# Patient Record
Sex: Female | Born: 1985 | Race: Black or African American | Hispanic: No | Marital: Single | Smoking: Former smoker
Health system: Southern US, Community
[De-identification: ages and names within clinical notes are randomized; demographics above are authoritative.]

## PROBLEM LIST (undated history)

## (undated) HISTORY — PX: OTHER SURGICAL HISTORY: SHX169

---

## 2007-10-25 ENCOUNTER — Emergency Department: Payer: Self-pay | Admitting: Emergency Medicine

## 2010-06-26 ENCOUNTER — Emergency Department: Payer: Self-pay | Admitting: Internal Medicine

## 2010-08-19 ENCOUNTER — Emergency Department: Payer: Self-pay | Admitting: Emergency Medicine

## 2010-11-30 ENCOUNTER — Emergency Department: Payer: Self-pay | Admitting: Emergency Medicine

## 2011-09-14 ENCOUNTER — Emergency Department: Payer: Self-pay | Admitting: Emergency Medicine

## 2012-07-12 ENCOUNTER — Emergency Department: Payer: Self-pay | Admitting: Emergency Medicine

## 2012-07-12 LAB — URINALYSIS, COMPLETE
Blood: NEGATIVE
Glucose,UR: NEGATIVE mg/dL (ref 0–75)
Ph: 8 (ref 4.5–8.0)
Protein: 25
Squamous Epithelial: 27
WBC UR: 4 /HPF (ref 0–5)

## 2013-08-28 ENCOUNTER — Emergency Department: Payer: Self-pay | Admitting: Emergency Medicine

## 2013-10-11 ENCOUNTER — Emergency Department: Payer: Self-pay | Admitting: Emergency Medicine

## 2013-10-11 LAB — URINALYSIS, COMPLETE
Bilirubin,UR: NEGATIVE
Glucose,UR: NEGATIVE mg/dL (ref 0–75)
Nitrite: POSITIVE
PH: 5 (ref 4.5–8.0)
Protein: 30
Specific Gravity: 1.02 (ref 1.003–1.030)
Squamous Epithelial: 15

## 2013-10-11 LAB — COMPREHENSIVE METABOLIC PANEL
ALBUMIN: 3.7 g/dL (ref 3.4–5.0)
Alkaline Phosphatase: 58 U/L
Anion Gap: 7 (ref 7–16)
BUN: 6 mg/dL — ABNORMAL LOW (ref 7–18)
Bilirubin,Total: 1.3 mg/dL — ABNORMAL HIGH (ref 0.2–1.0)
CALCIUM: 9 mg/dL (ref 8.5–10.1)
CREATININE: 0.97 mg/dL (ref 0.60–1.30)
Chloride: 104 mmol/L (ref 98–107)
Co2: 23 mmol/L (ref 21–32)
EGFR (African American): >60
Glucose: 105 mg/dL — ABNORMAL HIGH (ref 65–99)
OSMOLALITY: 266 (ref 275–301)
POTASSIUM: 3.5 mmol/L (ref 3.5–5.1)
SGOT(AST): 17 U/L (ref 15–37)
SGPT (ALT): 13 U/L (ref 12–78)
Sodium: 134 mmol/L — ABNORMAL LOW (ref 136–145)
TOTAL PROTEIN: 8.2 g/dL (ref 6.4–8.2)

## 2013-10-11 LAB — CBC WITH DIFFERENTIAL/PLATELET
Basophil #: 0.1 10*3/uL (ref 0.0–0.1)
Basophil %: 0.8 %
Eosinophil #: 0 10*3/uL (ref 0.0–0.7)
Eosinophil %: 0.2 %
HCT: 41.9 % (ref 35.0–47.0)
HGB: 13.7 g/dL (ref 12.0–16.0)
LYMPHS PCT: 13.7 %
Lymphocyte #: 2.1 10*3/uL (ref 1.0–3.6)
MCH: 30.2 pg (ref 26.0–34.0)
MCHC: 32.7 g/dL (ref 32.0–36.0)
MCV: 92 fL (ref 80–100)
MONO ABS: 1.5 x10 3/mm — AB (ref 0.2–0.9)
Monocyte %: 9.6 %
Neutrophil #: 11.6 10*3/uL — ABNORMAL HIGH (ref 1.4–6.5)
Neutrophil %: 75.7 %
PLATELETS: 251 10*3/uL (ref 150–440)
RBC: 4.54 10*6/uL (ref 3.80–5.20)
RDW: 13.6 % (ref 11.5–14.5)
WBC: 15.4 10*3/uL — ABNORMAL HIGH (ref 3.6–11.0)

## 2013-10-11 LAB — LIPASE, BLOOD: LIPASE: 66 U/L — AB (ref 73–393)

## 2013-10-13 LAB — URINE CULTURE

## 2013-11-30 ENCOUNTER — Emergency Department: Payer: Self-pay | Admitting: Emergency Medicine

## 2013-11-30 LAB — CBC
HCT: 38.4 % (ref 35.0–47.0)
HGB: 12.7 g/dL (ref 12.0–16.0)
MCH: 31.1 pg (ref 26.0–34.0)
MCHC: 33.1 g/dL (ref 32.0–36.0)
MCV: 94 fL (ref 80–100)
PLATELETS: 234 10*3/uL (ref 150–440)
RBC: 4.08 10*6/uL (ref 3.80–5.20)
RDW: 13.7 % (ref 11.5–14.5)
WBC: 9.4 10*3/uL (ref 3.6–11.0)

## 2013-11-30 LAB — HCG, QUANTITATIVE, PREGNANCY: Beta Hcg, Quant.: 65 m[IU]/mL — ABNORMAL HIGH

## 2016-04-30 IMAGING — CT CT ABD-PELV W/ CM
2 of 4 series · 15 of 46 positions shown, 17 images · IV contrast (isovue)
Comparison: None.

CLINICAL DATA: Sharp pain in the right mid side of abdomen since
yesterday. Nausea and vomiting.

EXAM:
CT ABDOMEN AND PELVIS WITH CONTRAST
TECHNIQUE: Multidetector CT imaging of the abdomen and pelvis was performed
using the standard protocol following bolus administration of
intravenous contrast.
CONTRAST:  85 ml  Isovue 300

[Series 2: routine abd pel with · axial · 0.59mm/px · z∈[-995,-625]mm · 12 of 85 slices shown, 14 images]
[im 7/85  soft-tissue]
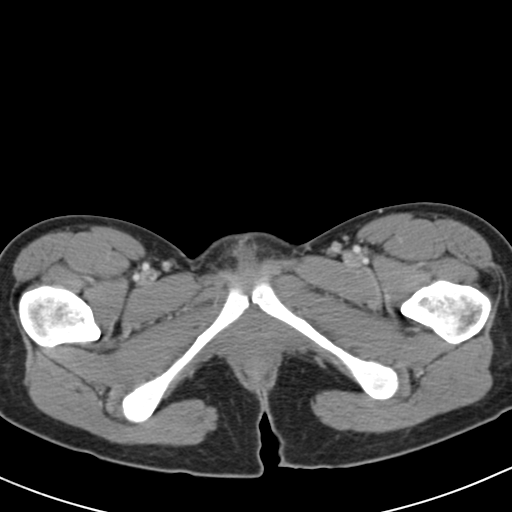
[im 7/85  bone]
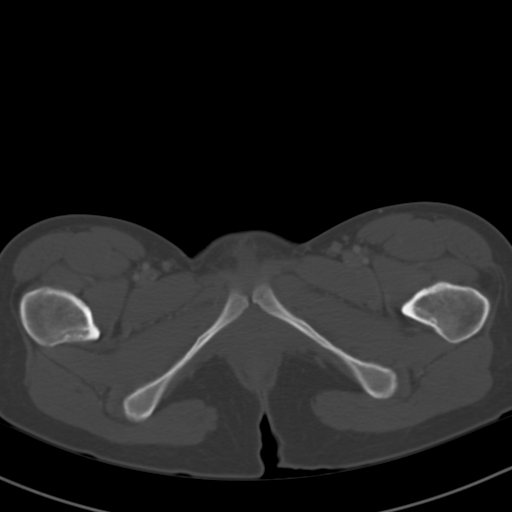
[im 14/85  soft-tissue]
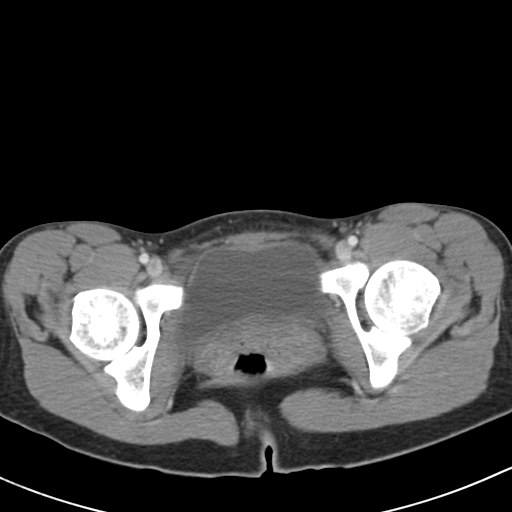
[im 21/85  soft-tissue]
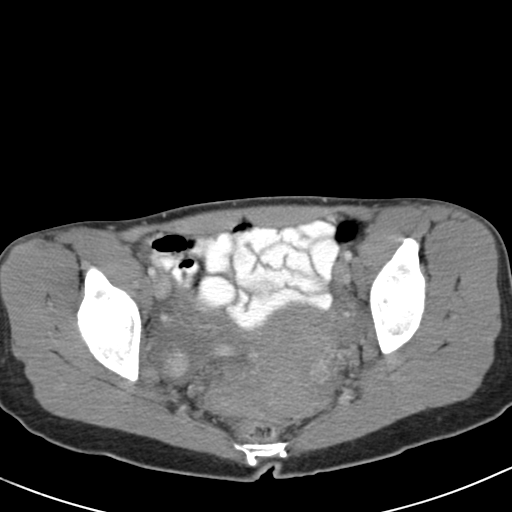
[im 27/85  soft-tissue]
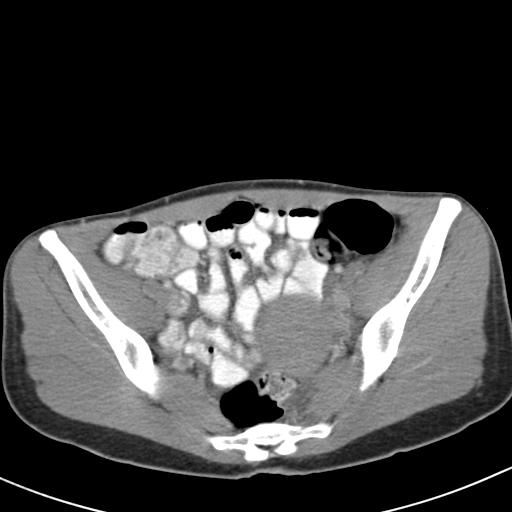
[im 34/85  soft-tissue]
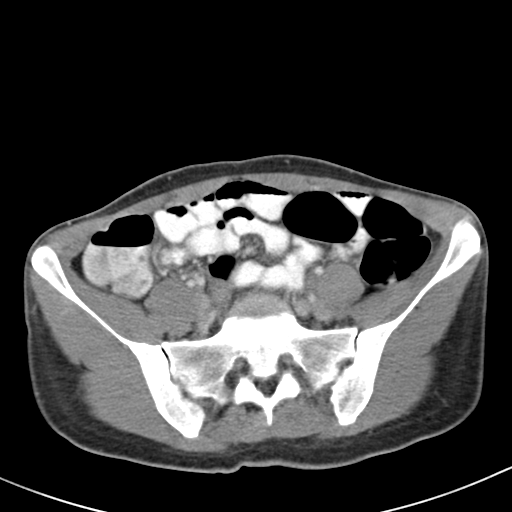
[im 41/85  soft-tissue]
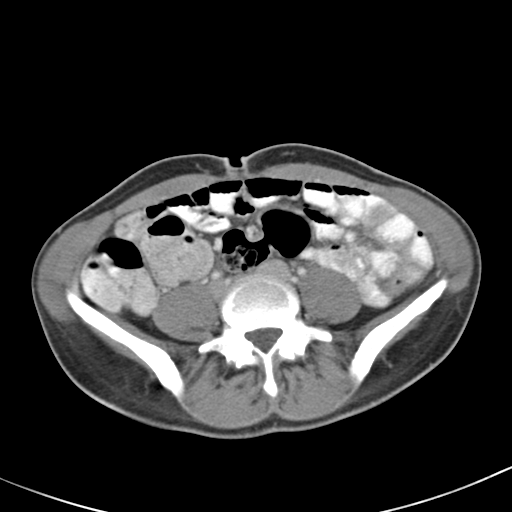
[im 48/85  soft-tissue]
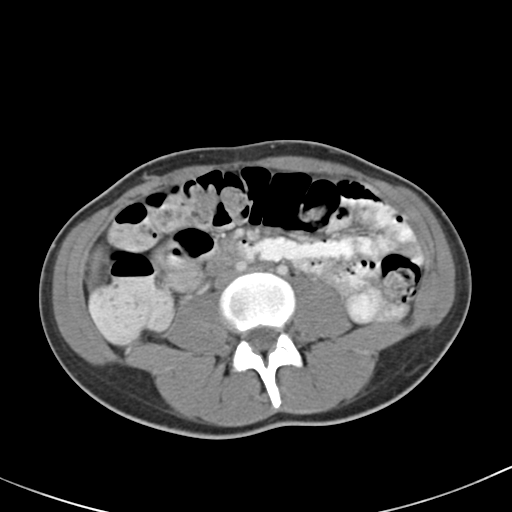
[im 54/85  soft-tissue]
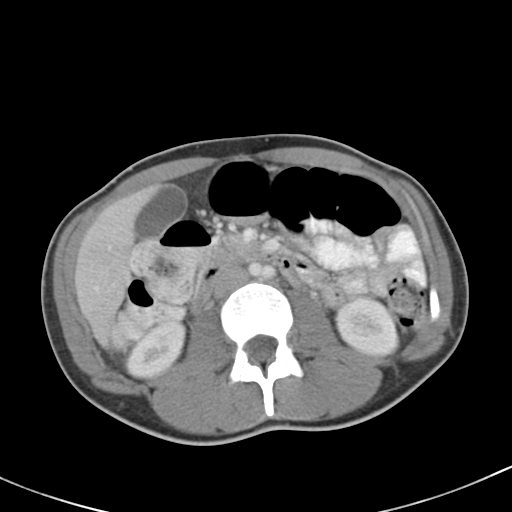
[im 61/85  soft-tissue]
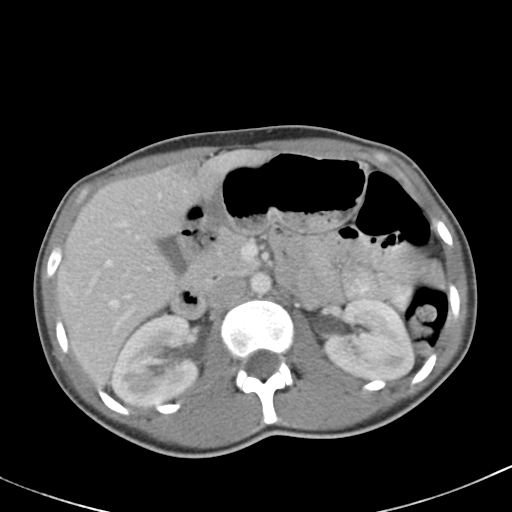
[im 61/85  bone]
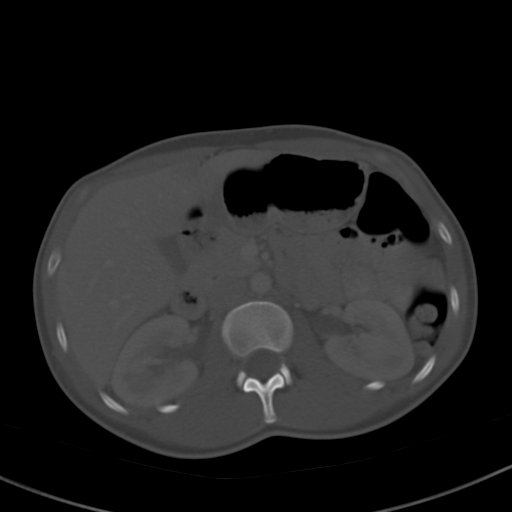
[im 68/85  soft-tissue]
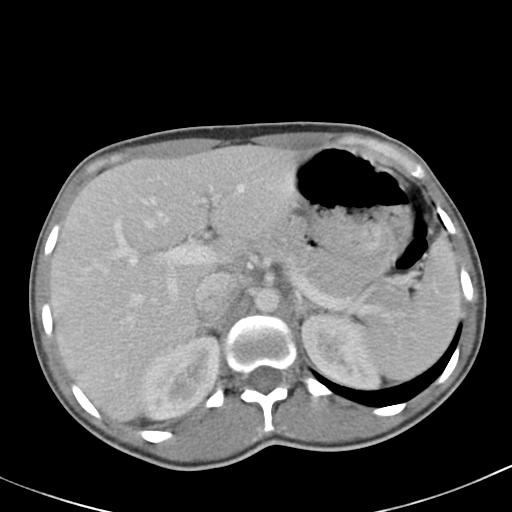
[im 74/85  soft-tissue]
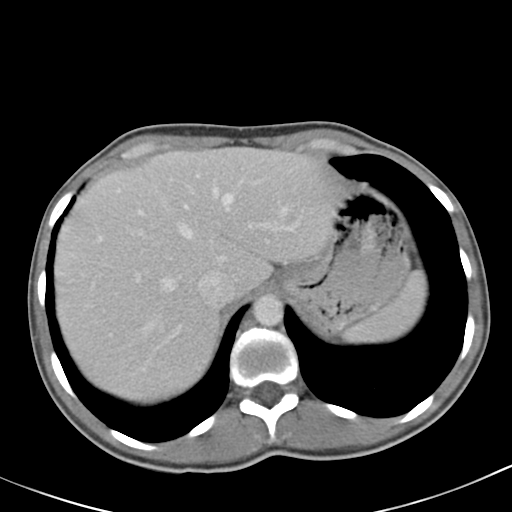
[im 81/85  soft-tissue]
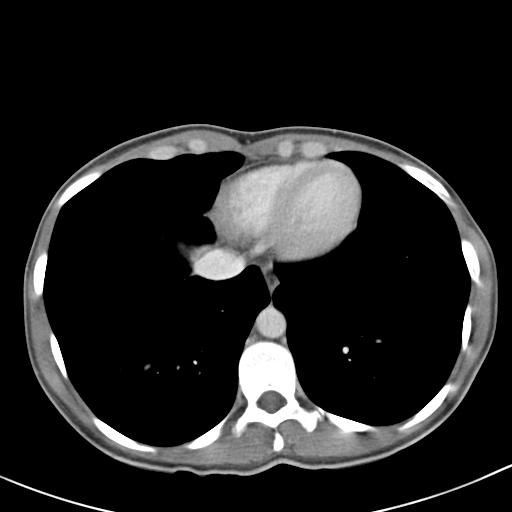

[Series 5: cor routine abd pel with · coronal · 0.57mm/px · 3 of 99 slices shown]
[im 33/99  soft-tissue]
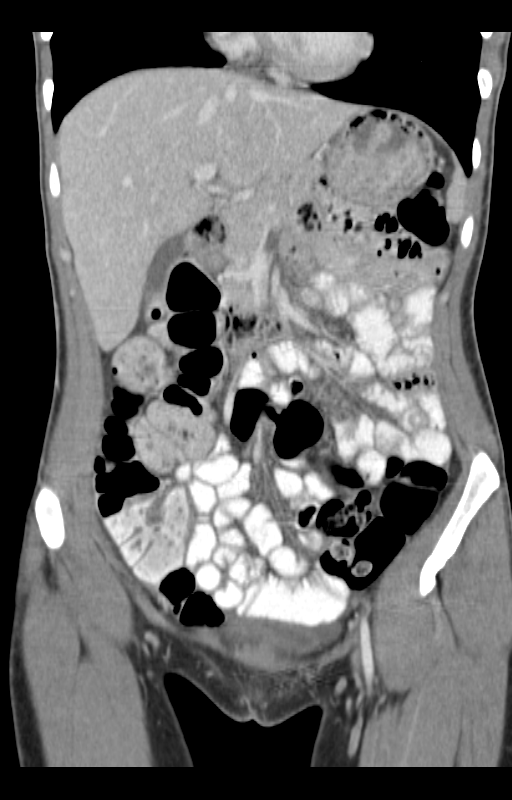
[im 44/99  soft-tissue]
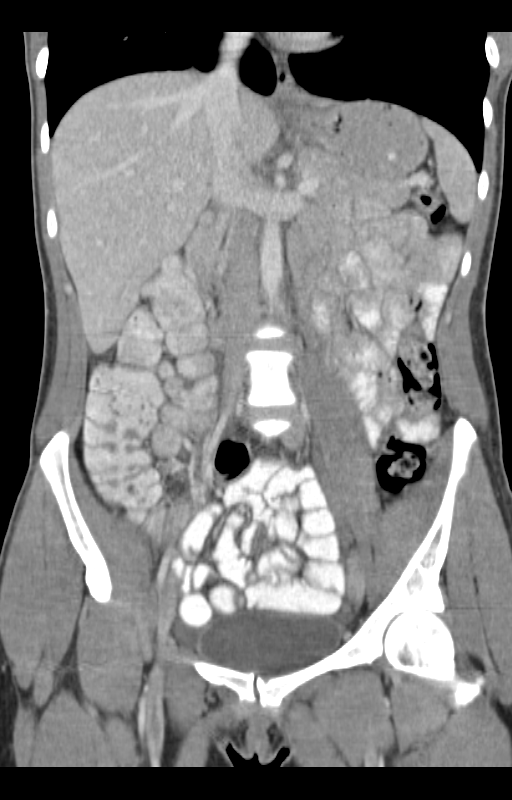
[im 55/99  soft-tissue]
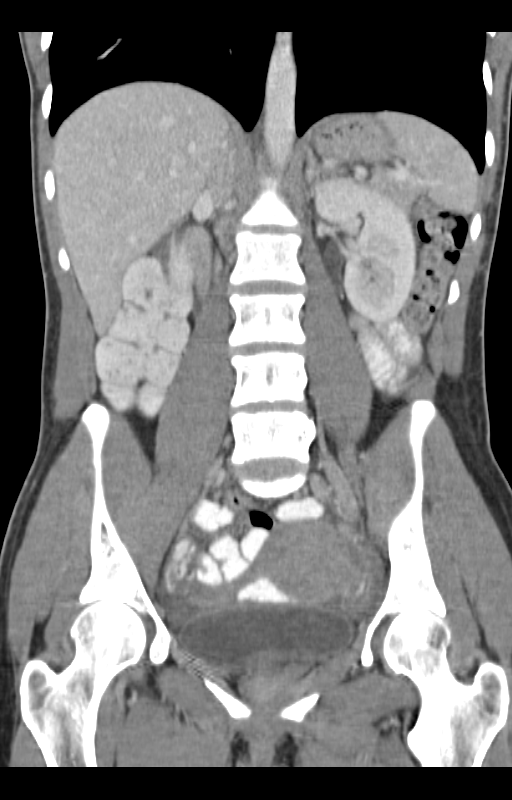

[15 of 46 positions shown; findings below may reference images not displayed]

FINDINGS: Lung bases are clear.

Focal low-attenuation in the liver adjacent to the falciform
ligament consistent with focal fatty infiltration. Tiny cyst in the
liver. The gallbladder, spleen, pancreas, adrenal glands, abdominal
aorta, inferior vena cava, and retroperitoneal lymph nodes are
unremarkable. There is focal heterogeneous appearance to the
nephrogram in the midportion of the right kidney posteriorly.
Visualization is limited as no delayed images were obtained but the
appearance is suspicious for focal process such as focal
pyelonephritis or possibly of focal mass lesion. No hydronephrosis
in either kidney. The stomach, small bowel, and colon are not
abnormally distended. No free air or free fluid in the abdomen.

Pelvis: The bladder wall is not thickened. Uterus and ovaries are
not enlarged. Small amount of free fluid in the pelvis is likely
physiologic. The probable involuting cyst in the right ovary. No
evidence of diverticulitis. Appendix is normal. No destructive bone
lesions.
IMPRESSION: Focal heterogeneous nephrogram in the right mid kidney probably
representing focal pyelonephritis although a mass lesion is not
entirely excluded. Correlation with clinical or laboratory evidence
of pyelonephritis is recommended. Consider followup after resolution
of acute process.

## 2021-11-28 ENCOUNTER — Ambulatory Visit: Payer: Medicaid Other | Admitting: Nurse Practitioner

## 2021-11-28 ENCOUNTER — Encounter: Payer: Self-pay | Admitting: Nurse Practitioner

## 2021-11-28 DIAGNOSIS — Z113 Encounter for screening for infections with a predominantly sexual mode of transmission: Secondary | ICD-10-CM | POA: Diagnosis not present

## 2021-11-28 LAB — HM HEPATITIS C SCREENING LAB: HM Hepatitis Screen: NEGATIVE

## 2021-11-28 LAB — HEPATITIS B SURFACE ANTIGEN

## 2021-11-28 LAB — WET PREP FOR TRICH, YEAST, CLUE
Trichomonas Exam: NEGATIVE
Yeast Exam: NEGATIVE

## 2021-11-28 LAB — HM HIV SCREENING LAB: HM HIV Screening: NEGATIVE

## 2021-11-28 NOTE — Progress Notes (Signed)
[facility] Department  STI clinic/screening visit [address] [place] [address] [phone]  Subjective:  Tykiera Commander is a 36 y.o. female being seen today for an STI screening visit. The patient reports they do not have symptoms.  Patient reports that they do not desire a pregnancy in the next year.   They reported they are not interested in discussing contraception today.    Patient's last menstrual period was 11/16/2021.   Patient has the following medical conditions:  There are no problems to display for this patient.   Chief Complaint  Patient presents with   SEXUALLY TRANSMITTED DISEASE    Screening    HPI  Patient reports to clinic today for an STD screening.  Patient is currently asymptomatic.    Last HIV test per patient/review of record was: Unsure Patient reports last pap was: Unsure   Screening for MPX risk: Does the patient have an unexplained rash? No Is the patient MSM? No Does the patient endorse multiple sex partners or anonymous sex partners? No Did the patient have close or sexual contact with a person diagnosed with MPX? No Has the patient traveled outside the [place] where MPX is endemic? No Is there a high clinical suspicion for MPX-- evidenced by one of the following No  -Unlikely to be chickenpox  -Lymphadenopathy  -Rash that present in same phase of evolution on any given body part See flowsheet for further details and programmatic requirements.   Immunization history:   There is no immunization history on file for this patient.   The following portions of the patient's history were reviewed and updated as appropriate: allergies, current medications, past medical history, past social history, past surgical history and problem list.  Objective:  There were no vitals filed for this visit.  Physical Exam Constitutional:      Appearance: Normal appearance.  HENT:     Head: Normocephalic. No abrasion, masses or  laceration. Hair is normal.     Mouth/Throat:     Mouth: No oral lesions.     Dentition: No dental caries.     Pharynx: No oropharyngeal exudate or posterior oropharyngeal erythema.     Tonsils: No tonsillar exudate or tonsillar abscesses.  Eyes:     General: Lids are normal.        Right eye: No discharge.        Left eye: No discharge.     Conjunctiva/sclera: Conjunctivae normal.     Right eye: No exudate.    Left eye: No exudate. Abdominal:     General: Abdomen is flat.     Palpations: Abdomen is soft.     Tenderness: There is no abdominal tenderness. There is no rebound.  Genitourinary:    Pubic Area: No rash or pubic lice.      Labia:        Right: No rash, tenderness, lesion or injury.        Left: No rash, tenderness, lesion or injury.      Vagina: Normal. No vaginal discharge, erythema or lesions.     Cervix: No cervical motion tenderness, discharge, lesion or erythema.     Uterus: Not enlarged and not tender.      Rectum: Normal.     Comments: Amount Discharge: small  Odor: No pH: less than 4.5 Adheres to vaginal wall: No Color:  color of discharge matches the Shariff Lasky swab  Musculoskeletal:     Cervical back: Full passive range of motion without pain,  normal range of motion and neck supple.  Lymphadenopathy:     Cervical: No cervical adenopathy.     Right cervical: No superficial, deep or posterior cervical adenopathy.    Left cervical: No superficial, deep or posterior cervical adenopathy.     Upper Body:     Right upper body: No supraclavicular, axillary or epitrochlear adenopathy.     Left upper body: No supraclavicular, axillary or epitrochlear adenopathy.     Lower Body: No right inguinal adenopathy. No left inguinal adenopathy.  Skin:    General: Skin is warm and dry.     Findings: No lesion or rash.  Neurological:     Mental Status: She is alert and oriented to person, place, and time.  Psychiatric:        Attention and Perception: Attention normal.         Mood and Affect: Mood normal.        Speech: Speech normal.        Behavior: Behavior normal. Behavior is cooperative.      Assessment and Plan:  Thania Dewaele is a 36 y.o. female presenting to the [facility] Department for STI screening  1. Screening examination for venereal disease -36 year old female in clinic today for STD screening. -Patient accepted all screenings including oral GC, vaginal CT/GC, wet prep, and bloodwork for HIV/RPR.  Patient meets criteria for HepB screening? Yes. Ordered? Yes Patient meets criteria for HepC screening? Yes. Ordered? Yes  Treat wet prep per standing order Discussed time line for [facility] results and that patient will be called with positive results and encouraged patient to call if she had not heard in 2 weeks.  Counseled to return or seek care for continued or worsening symptoms Recommended condom use with all sex  Patient is currently not using  contraception  to prevent pregnancy.    - HIV/HCV [facility] - Syphilis Serology, [facility] - HBV Antigen/Antibody [facility] - Chlamydia/Gonorrhea [facility] - WET PREP FOR TRICH, YEAST, CLUE - Gonococcus culture     Return if symptoms worsen or fail to improve.    Glenna Fellows, FNP

## 2021-11-28 NOTE — Progress Notes (Signed)
Pt here for STD screening.  Wet mount results reviewed, no treatment required per SO.  Pt  notified that her Pap smear is past due and PCP list provided to pt.  Condoms given.  Berdie Ogren, RN

## 2021-12-03 LAB — GONOCOCCUS CULTURE

## 2023-06-10 ENCOUNTER — Ambulatory Visit (INDEPENDENT_AMBULATORY_CARE_PROVIDER_SITE_OTHER): Payer: Medicaid Other

## 2023-06-10 ENCOUNTER — Ambulatory Visit
Admission: EM | Admit: 2023-06-10 | Discharge: 2023-06-10 | Disposition: A | Discharge status: Home / Self Care | Payer: Medicaid Other | Attending: Emergency Medicine | Admitting: Emergency Medicine

## 2023-06-10 DIAGNOSIS — M79675 Pain in left toe(s): Secondary | ICD-10-CM

## 2023-06-10 DIAGNOSIS — S92515A Nondisplaced fracture of proximal phalanx of left lesser toe(s), initial encounter for closed fracture: Secondary | ICD-10-CM | POA: Diagnosis not present

## 2023-06-10 NOTE — ED Provider Notes (Addendum)
 [facility]    CSN: [id number] Arrival date & time: 06/10/23  1302      History   Chief Complaint Chief Complaint  Patient presents with   Toe Pain    HPI Kathryn Hall is a 38 y.o. female.   HPI  38 year old female with no significant past medical history presents for evaluation of pain in her left fifth toe.  She reports that she was walking this morning and struck her toe against the door jam of the bathroom.  She heard a crack.  There is some pain and swelling as well as bruising.  Pain is primarily with ambulation and range of motion of the toe.  No numbness or tingling.  History reviewed. No pertinent past medical history.  There are no active problems to display for this patient.   Past Surgical History:  Procedure Laterality Date   recontructive surgery on nose      OB History   No obstetric history on file.      Home Medications    Prior to Admission medications   Not on File    Family History History reviewed. No pertinent family history.  Social History Social History   Tobacco Use   Smoking status: Former    Current packs/day: 0.00    Types: Cigarettes    Quit date: 11/28/2020    Years since quitting: 2.5   Smokeless tobacco: Never  Vaping Use   Vaping status: Every Day   Substances: Nicotine, Flavoring  Substance Use Topics   Alcohol use: Yes    Comment: occassionally   Drug use: Yes    Types: Marijuana    Comment: daily     Allergies   Patient has no known allergies.   Review of Systems Review of Systems  Musculoskeletal:  Positive for arthralgias and joint swelling.  Skin:  Positive for color change.  Neurological:  Negative for numbness.     Physical Exam Triage Vital Signs ED Triage Vitals [06/10/23 1311]  Encounter Vitals Group     BP      Systolic BP Percentile      Diastolic BP Percentile      Pulse      Resp 16     Temp      Temp Source Oral     SpO2      Weight      Height      Head  Circumference      Peak Flow      Pain Score      Pain Loc      Pain Education      Exclude from Growth Chart    No data found.  Updated Vital Signs BP 121/69 (BP Location: Left Arm)   Pulse 63   Temp 98.3 F (36.8 C) (Oral)   Resp 16   Ht 5' 7 (1.702 m)   Wt 110 lb (49.9 kg)   LMP 05/18/2023 (Approximate)   SpO2 100%   BMI 17.23 kg/m   Visual Acuity Right Eye Distance:   Left Eye Distance:   Bilateral Distance:    Right Eye Near:   Left Eye Near:    Bilateral Near:     Physical Exam Vitals and nursing note reviewed.  Constitutional:      Appearance: Normal appearance. She is not ill-appearing.  HENT:     Head: Normocephalic and atraumatic.  Musculoskeletal:        General: Swelling, tenderness and signs of injury present.  Skin:    General: Skin is warm and dry.     Capillary Refill: Capillary refill takes less than 2 seconds.     Findings: Bruising present.  Neurological:     General: No focal deficit present.     Mental Status: She is alert and oriented to person, place, and time.      UC Treatments / Results  Labs (all labs ordered are listed, but only abnormal results are displayed) Labs Reviewed - No data to display  EKG   Radiology No results found.  Procedures Procedures (including critical care time)  Medications Ordered in UC Medications - No data to display  Initial Impression / Assessment and Plan / UC Course  I have reviewed the triage vital signs and the nursing notes.  Pertinent labs & imaging results that were available during my care of the patient were reviewed by me and considered in my medical decision making (see chart for details).   Patient is a nontoxic-appearing 38 year old female presenting for evaluation of pain at the MTP joint of her left fifth toe after impacting the door frame of her bathroom this morning.  As you can see in image above, there is ecchymosis proximal and medial to the MTP joint of the left fifth  toe as well as edema and mild erythema of the left fifth toe itself.  Patient has point tenderness over the MTP joint without appreciable crepitus.  Distal metatarsal is unremarkable.  Middle and distal phalanx of the left fifth toe are also unremarkable.  Sensation is intact.  I will obtain radiograph to evaluate for acute bony abnormality.  X-rays of the left fifth toe independently reviewed and evaluated by me.  Impression: There is a nondisplaced fracture of the proximal shaft of the proximal phalange E.  Soft tissue swelling is also present.  Radiology overread is pending. Radiology impression states transverse fracture of the proximal diaphyseal metaphyseal junction of the proximal phalanx of the small toe.  I will discharge patient on the diagnosis of proximal phalanx fracture with a postoperative shoe and instructions for buddy taping.  I will also provide contact information for podiatry for follow-up should her pain not improve.  Final Clinical Impressions(s) / UC Diagnoses   Final diagnoses:  Pain of toe of left foot  Closed nondisplaced fracture of proximal phalanx of lesser toe of left foot, initial encounter     Discharge Instructions      Your x-ray revealed that you have a nondisplaced fracture of your left fifth toe.  This should heal fine on its own.  For comfort I suggest that she wear the postoperative shoe.  This will minimize flexion of your toe and aid in healing and in pain relief.  You may also tape your fourth and fifth toe together for stability and comfort.  Keep your left foot elevated as much as possible.  This will decrease swelling and aid in pain relief.  You may apply ice to your toe for 20 minutes at a time, 2-3 times a day, as needed for pain and swelling.  Use over-the-counter Tylenol and/or ibuprofen according the package instructions as needed for fever or pain.  If you do not have any improvement of your pain, or your pain worsens, I recommend  that you follow-up with podiatry.  I have given you the contact information in your discharge instructions.     ED Prescriptions   None    PDMP not reviewed this encounter.   Bernardino Ditch, NP  06/10/23 1425    Bernardino Ditch, NP 06/10/23 1438

## 2023-06-10 NOTE — ED Triage Notes (Signed)
 Pt c/o L pinky toe pain since this AM. States she hit toe against bathroom wall & heard a crack.

## 2023-06-10 NOTE — Discharge Instructions (Addendum)
 Your x-ray revealed that you have a nondisplaced fracture of your left fifth toe.  This should heal fine on its own.  For comfort I suggest that she wear the postoperative shoe.  This will minimize flexion of your toe and aid in healing and in pain relief.  You may also tape your fourth and fifth toe together for stability and comfort.  Keep your left foot elevated as much as possible.  This will decrease swelling and aid in pain relief.  You may apply ice to your toe for 20 minutes at a time, 2-3 times a day, as needed for pain and swelling.  Use over-the-counter Tylenol and/or ibuprofen according the package instructions as needed for fever or pain.  If you do not have any improvement of your pain, or your pain worsens, I recommend that you follow-up with podiatry.  I have given you the contact information in your discharge instructions.

## 2023-06-26 ENCOUNTER — Ambulatory Visit: Payer: Medicaid Other | Admitting: Podiatry

## 2023-07-08 ENCOUNTER — Ambulatory Visit: Payer: Medicaid Other | Admitting: Podiatry

## 2023-07-08 ENCOUNTER — Encounter: Payer: Self-pay | Admitting: Podiatry

## 2023-07-08 VITALS — Ht 67.0 in | Wt 110.0 lb

## 2023-07-08 DIAGNOSIS — S92515D Nondisplaced fracture of proximal phalanx of left lesser toe(s), subsequent encounter for fracture with routine healing: Secondary | ICD-10-CM

## 2023-07-08 NOTE — Progress Notes (Signed)
  Subjective:  Patient ID: Kathryn Hall, female    DOB: 07-28-1985,  MRN: [id number]  Chief Complaint  Patient presents with   Toe Injury    Pt is here due to pinky toe on the left foot being broken in January, pt states she was in at urgent care for the issue X-RAYS were done and has been wearing a post op shoe since.    38 y.o. female presents with the above complaint.  Patient presents with complaint of left fifth digit fracture.  She states it happened in beginning January.  She states it still causing her some discomfort she is going on a trip wanted to make sure she is okay to go denies any other acute complaints she would like to discuss treatment options she went to urgent care who did x-rays.  She is ambulating with surgical shoe   Review of Systems: Negative except as noted in the HPI. Denies N/V/F/Ch.  No past medical history on file. No current outpatient medications on file.  Social History   Tobacco Use  Smoking Status Former   Current packs/day: 0.00   Types: Cigarettes   Quit date: 11/28/2020   Years since quitting: 2.6  Smokeless Tobacco Never    No Known Allergies Objective:  There were no vitals filed for this visit. Body mass index is 17.23 kg/m. Constitutional Well developed. Well nourished.  Vascular Dorsalis pedis pulses palpable bilaterally. Posterior tibial pulses palpable bilaterally. Capillary refill normal to all digits.  No cyanosis or clubbing noted. Pedal hair growth normal.  Neurologic Normal speech. Oriented to person, place, and time. Epicritic sensation to light touch grossly present bilaterally.  Dermatologic Nails well groomed and normal in appearance. No open wounds. No skin lesions.  Orthopedic: Pain on palpation left fifth digit no ecchymosis bruising noted mild swelling noted pain with range of motion.   Radiographs: None Assessment:   1. Closed nondisplaced fracture of proximal phalanx of lesser toe of left foot with routine  healing, subsequent encounter    Plan:  Patient was evaluated and treated and all questions answered.  Left fifth digit fracture healing -All questions and concerns were discussed with the patient in extensive detail -Continue wear surgical shoe for another week or 2 -I discussed with her that this can take up to 6 to 8 weeks to completely heal.  She states understanding.  If it does not resolve or get better she will come back and see me.  No follow-ups on file.

## 2023-08-01 ENCOUNTER — Other Ambulatory Visit: Payer: Self-pay | Admitting: Student
